# Patient Record
Sex: Female | Born: 1975 | Race: White | Hispanic: No | Marital: Married | Smoking: Never smoker
Health system: Southern US, Community
[De-identification: ages and names within clinical notes are randomized; demographics above are authoritative.]

## PROBLEM LIST (undated history)

## (undated) DIAGNOSIS — M545 Low back pain, unspecified: Secondary | ICD-10-CM

## (undated) DIAGNOSIS — G43909 Migraine, unspecified, not intractable, without status migrainosus: Secondary | ICD-10-CM

## (undated) HISTORY — DX: Low back pain: M54.5

## (undated) HISTORY — DX: Low back pain, unspecified: M54.50

## (undated) HISTORY — PX: BACK SURGERY: SHX140

---

## 2002-10-12 ENCOUNTER — Encounter: Payer: Self-pay | Admitting: Family Medicine

## 2002-10-12 ENCOUNTER — Ambulatory Visit (HOSPITAL_COMMUNITY): Admission: RE | Admit: 2002-10-12 | Discharge: 2002-10-12 | Payer: Self-pay | Admitting: Family Medicine

## 2010-01-11 ENCOUNTER — Emergency Department (HOSPITAL_BASED_OUTPATIENT_CLINIC_OR_DEPARTMENT_OTHER)
Admission: EM | Admit: 2010-01-11 | Discharge: 2010-01-12 | Payer: Self-pay | Source: Home / Self Care | Admitting: Emergency Medicine

## 2012-10-20 ENCOUNTER — Emergency Department (HOSPITAL_BASED_OUTPATIENT_CLINIC_OR_DEPARTMENT_OTHER)
Admission: EM | Admit: 2012-10-20 | Discharge: 2012-10-20 | Disposition: A | Discharge status: Home / Self Care | Payer: 59 | Attending: Emergency Medicine | Admitting: Emergency Medicine

## 2012-10-20 ENCOUNTER — Encounter (HOSPITAL_BASED_OUTPATIENT_CLINIC_OR_DEPARTMENT_OTHER): Payer: Self-pay | Admitting: Emergency Medicine

## 2012-10-20 ENCOUNTER — Emergency Department (HOSPITAL_BASED_OUTPATIENT_CLINIC_OR_DEPARTMENT_OTHER): Payer: 59

## 2012-10-20 DIAGNOSIS — Y9289 Other specified places as the place of occurrence of the external cause: Secondary | ICD-10-CM | POA: Insufficient documentation

## 2012-10-20 DIAGNOSIS — S339XXA Sprain of unspecified parts of lumbar spine and pelvis, initial encounter: Secondary | ICD-10-CM | POA: Insufficient documentation

## 2012-10-20 DIAGNOSIS — Y9302 Activity, running: Secondary | ICD-10-CM | POA: Insufficient documentation

## 2012-10-20 DIAGNOSIS — S39012A Strain of muscle, fascia and tendon of lower back, initial encounter: Secondary | ICD-10-CM

## 2012-10-20 DIAGNOSIS — X58XXXA Exposure to other specified factors, initial encounter: Secondary | ICD-10-CM | POA: Insufficient documentation

## 2012-10-20 MED ORDER — HYDROMORPHONE HCL PF 2 MG/ML IJ SOLN
2.0000 mg | Freq: Once | INTRAMUSCULAR | Status: AC
  Administered 2012-10-20: 2 mg via INTRAMUSCULAR
  Filled 2012-10-20: qty 1

## 2012-10-20 MED ORDER — CYCLOBENZAPRINE HCL 10 MG PO TABS: 10.0000 mg | ORAL_TABLET | Freq: Three times a day (TID) | ORAL | Status: DC | PRN

## 2012-10-20 MED ORDER — OXYCODONE-ACETAMINOPHEN 5-325 MG PO TABS: 1.0000 | ORAL_TABLET | Freq: Four times a day (QID) | ORAL | Status: DC | PRN

## 2012-10-20 MED ORDER — IBUPROFEN 800 MG PO TABS: 800.0000 mg | ORAL_TABLET | Freq: Three times a day (TID) | ORAL | Status: DC

## 2012-10-20 MED ORDER — KETOROLAC TROMETHAMINE 60 MG/2ML IM SOLN
60.0000 mg | Freq: Once | INTRAMUSCULAR | Status: AC
  Administered 2012-10-20: 60 mg via INTRAMUSCULAR
  Filled 2012-10-20: qty 2

## 2012-10-20 NOTE — ED Provider Notes (Signed)
CSN: [id number]     Arrival date & time 10/20/12  1308 History   First MD Initiated Contact with Patient 10/20/12 [phone]     Chief Complaint  Patient presents with  . Back Pain   (Consider location/radiation/quality/duration/timing/severity/associated sxs/prior Treatment) Patient is a 37 y.o. female presenting with back pain.  Back Pain  Pt with remote history of 'back problems' but who is otherwise very active running and exercising was doing an aerobic exercise 'boot camp' this morning when she had sudden onset of severe lower back pain, radiating to both sides and in L thigh but not down the leg. Pain is worse with movement, difficulty walking due to pain. No incontinence. No falls or trauma and no fever.   History reviewed. No pertinent past medical history. Past Surgical History  Procedure Laterality Date  . Cesarean section     No family history on file. History  Substance Use Topics  . Smoking status: Never Smoker   . Smokeless tobacco: Not on file  . Alcohol Use: No   OB History   Grav Para Term Preterm Abortions TAB SAB Ect Mult Living                 Review of Systems  Musculoskeletal: Positive for back pain.   All other systems reviewed and are negative except as noted in HPI.   Allergies  Review of patient's allergies indicates no known allergies.  Home Medications   Current Outpatient Rx  Name  Route  Sig  Dispense  Refill  . PRESCRIPTION MEDICATION      Unknown oral birth control          BP 121/67  Pulse 95  Temp(Src) 98.5 F (36.9 C) (Oral)  Resp 20  SpO2 100% Physical Exam  Nursing note and vitals reviewed. Constitutional: She is oriented to person, place, and time. She appears well-developed and well-nourished.  HENT:  Head: Normocephalic and atraumatic.  Eyes: EOM are normal. Pupils are equal, round, and reactive to light.  Neck: Normal range of motion. Neck supple.  Cardiovascular: Normal rate, normal heart sounds and intact distal  pulses.   Pulmonary/Chest: Effort normal and breath sounds normal.  Abdominal: Bowel sounds are normal. She exhibits no distension. There is no tenderness.  Musculoskeletal: Normal range of motion. She exhibits tenderness (tender over the lower lumbar/upper sacral area, moderate paraspinal muscle spasm, no sciatic notch tenderness bilaterally). She exhibits no edema.  Neurological: She is alert and oriented to person, place, and time. She has normal strength. She displays normal reflexes. No cranial nerve deficit or sensory deficit.  Skin: Skin is warm and dry. No rash noted.  Psychiatric: She has a normal mood and affect.    ED Course  Procedures (including critical care time) Labs Review Labs Reviewed - No data to display Imaging Review Dg Lumbar Spine 2-3 Views  10/20/2012   CLINICAL DATA:  37 year old female with low back pain, abrupt onset. Initial encounter.  EXAM: LUMBAR SPINE - 2-3 VIEW  COMPARISON:  01/11/2010.  FINDINGS: Mildly transitional anatomy. With the lowest full size ribs designated T12, lumbar segmentation is normal except for vestigial ribs at L1. Sacral ala and SI joints within normal limits. Stable and normal lumbar vertebral height and alignment. Stable and relatively preserved disc spaces. Bone mineralization is within normal limits. Visible lower thoracic levels appear intact.  IMPRESSION: No acute osseous abnormality identified in the lumbar spine.   Electronically Signed   By: Augusto Gamble M.D.   On:  10/20/2012 07:25    MDM   1. Lumbosacral strain, initial encounter     Xray images reviewed. Pt feeling much better. Suspect muscle strain/spasm. Will plan D/C with pain meds, muscle relaxer. Advised rest, warm shower and return for any worsening.     Mayumi Summerson B. Bernette Mayers, MD 10/20/12 [phone]

## 2012-10-20 NOTE — ED Notes (Signed)
Pt c/o lower back pain sudden onset while working out this am

## 2012-12-05 ENCOUNTER — Encounter: Payer: Self-pay | Admitting: Neurology

## 2012-12-05 ENCOUNTER — Ambulatory Visit (INDEPENDENT_AMBULATORY_CARE_PROVIDER_SITE_OTHER): Payer: 59 | Admitting: Neurology

## 2012-12-05 VITALS — BP 105/74 | HR 76 | Temp 97.8°F | Ht 64.0 in | Wt 138.6 lb

## 2012-12-05 DIAGNOSIS — M5417 Radiculopathy, lumbosacral region: Secondary | ICD-10-CM | POA: Insufficient documentation

## 2012-12-05 DIAGNOSIS — IMO0002 Reserved for concepts with insufficient information to code with codable children: Secondary | ICD-10-CM

## 2012-12-05 MED ORDER — IBUPROFEN 800 MG PO TABS: 800.0000 mg | ORAL_TABLET | Freq: Three times a day (TID) | ORAL | Status: DC

## 2012-12-05 MED ORDER — GABAPENTIN 100 MG PO CAPS: 100.0000 mg | ORAL_CAPSULE | Freq: Three times a day (TID) | ORAL | Status: DC

## 2012-12-05 NOTE — Progress Notes (Signed)
[facility] Clinic Note - Initial Visit   Date: 12/05/2012    Gabrielle Graham MRN: [id number] DOB: 1975-04-15   Dear Dr Daleen Squibb:  Thank you for your kind referral of Gabrielle Graham for consultation of back pain. Although her history is well known to you, please allow [place] to reiterate it for the purpose of our medical record. The patient was accompanied to the clinic by self.   History of Present Illness: Gabrielle Graham is a 37 y.o. year-old right-handed Caucasian female with no prior medical history presenting for evalulation of low back pain.    In mid-September 2014, she was doing jump squats at a bootcamp class.  When she came down after her third set, she heard a pop and when she landed, she was unable to walk.  She has excuciating back pain.  She went to [facility] where plain films of the x-ray was done and given muscle relaxants.  It improved slightly after 5-days, but then has progressively worsened.  She started developing tingling down her legs (left > right) and numbness of her feet.  Symptoms are better when she is standing and worse when she is laying flat or sitting.  She was given a medrol dose pak without significant change.  She takes ibuprofen 800mg  which eases the pain slightly.  She had not formal physical therapy.  Denies any bowel/bladder problems. No falls.  She takes flexiril when her pain is severe because it helps her rest better, but does not alleviate the pain. MRI performed on 11/6 of the lumbar spine shows focal central disc protrusion at L5-S1.  Out-side paper records, electronic medical record, and images have been reviewed where available and summarized as:  XR lumbar spine 10/20/2012 Mildly transitional anatomy. With the lowest full size ribs designated T12, lumbar segmentation is normal except for vestigial  ribs at L1. Sacral ala and SI joints within normal limits. Stable and normal lumbar vertebral height and alignment. Stable  and  relatively preserved disc spaces. Bone mineralization is within normal limits. Visible lower thoracic levels appear intact.  IMPRESSION:  No acute osseous abnormality identified in the lumbar spine.   MRI lumbar spine 11/25/2012:  focal central disc protrusion at L5-S1 contacting the ventral thecal sac and potentially irritating the S1 nerve roots.  No foraminal stenosis.   History reviewed. No pertinent past medical history.  Past Surgical History  Procedure Laterality Date  . Cesarean section       Medications:  Current Outpatient Prescriptions on File Prior to Visit  Medication Sig Dispense Refill  . cyclobenzaprine (FLEXERIL) 10 MG tablet Take 1 tablet (10 mg total) by mouth 3 (three) times daily as needed for muscle spasms.  30 tablet  0  . ibuprofen (ADVIL,MOTRIN) 800 MG tablet Take 1 tablet (800 mg total) by mouth 3 (three) times daily.  21 tablet  0  . oxyCODONE-acetaminophen (PERCOCET/ROXICET) 5-325 MG per tablet Take 1-2 tablets by mouth every 6 (six) hours as needed for pain.  20 tablet  0  . PRESCRIPTION MEDICATION Unknown oral birth control       No current facility-administered medications on file prior to visit.    Allergies: No Known Allergies  Family History: History reviewed. No pertinent family history.  Social History: History   Social History  . Marital Status: Married    Spouse Name: N/A    Number of Children: N/A  . Years of Education: N/A   Occupational History  . Not on file.  Social History Main Topics  . Smoking status: Never Smoker   . Smokeless tobacco: Not on file  . Alcohol Use: No  . Drug Use: Not on file  . Sexual Activity: Not on file   Other Topics Concern  . Not on file   Social History Narrative  . No narrative on file    Review of Systems:  CONSTITUTIONAL: No fevers, chills, night sweats, or weight loss.   EYES: No visual changes or eye pain ENT: No hearing changes.  No history of nose bleeds.   RESPIRATORY: No  cough, wheezing and shortness of breath.   CARDIOVASCULAR: Negative for chest pain, and palpitations.   GI: Negative for abdominal discomfort, blood in stools or black stools.  No recent change in bowel habits.   GU:  No history of incontinence.   MUSCLOSKELETAL: + back pain.  No myalgias.   SKIN: Negative for lesions, rash, and itching.   HEMATOLOGY/ONCOLOGY: Negative for prolonged bleeding, bruising easily, and swollen nodes.  No history of cancer.   ENDOCRINE: Negative for cold or heat intolerance, polydipsia or goiter.   PSYCH:  No depression or anxiety symptoms.   NEURO: As Above.   Vital Signs:  BP 105/74  Pulse 76  Temp(Src) 97.8 F (36.6 C) (Oral)  Ht 5\' 4"  (1.626 m)  Wt 138 lb 9.6 oz (62.869 kg)  BMI 23.78 kg/m2  LMP 11/02/2012   General Medical Exam:   General:  Mild distress due to pain, comfortable.   Eyes/ENT: see cranial nerve examination.   Back:  Tenderness to palpation over the lumbosacral paraspinal muscles.   Extremities:  No deformities, edema, or skin discoloration.  Skin:  Port wine stain birthmark present over the right upper extremity.   Neurological Exam: MENTAL STATUS including orientation to time, place, person, recent and remote memory, attention span and concentration, language, and fund of knowledge is normal.  Speech is not dysarthric.  CRANIAL NERVES: II:  No visual field defects.  Unremarkable fundi.   III-IV-VI: Pupils equal round and reactive to light.  Normal conjugate, extra-ocular eye movements in all directions of gaze.  No nystagmus.  No ptosis.   V:  Normal facial sensation.     VII:  Normal facial symmetry and movements.   VIII:  Normal hearing and vestibular function.   IX-X:  Normal palatal movement.   XI:  Normal shoulder shrug and head rotation.   XII:  Normal tongue strength and range of motion, no deviation or fasciculation.  MOTOR:  *Motor strength testing of the legs is somewhat pain limited, but there is no focal weakness.   No atrophy, fasciculations or abnormal movements.  No pronator drift.  Tone is normal.    Right Upper Extremity:    Left Upper Extremity:    Deltoid  5/5   Deltoid  5/5   Biceps  5/5   Biceps  5/5   Triceps  5/5   Triceps  5/5   Wrist extensors  5/5   Wrist extensors  5/5   Wrist flexors  5/5   Wrist flexors  5/5   Finger extensors  5/5   Finger extensors  5/5   Finger flexors  5/5   Finger flexors  5/5   Dorsal interossei  5/5   Dorsal interossei  5/5   Abductor pollicis  5/5   Abductor pollicis  5/5   Tone (Ashworth scale)  0  Tone (Ashworth scale)  0   Right Lower Extremity:*    Left Lower  Extremity: *   Hip flexors  5/5   Hip flexors  5/5   Hip extensors  5/5   Hip extensors  5/5   Knee flexors  5/5   Knee flexors  5/5   Knee extensors  5/5   Knee extensors  5/5   Dorsiflexors  5/5   Dorsiflexors  5/5   Plantarflexors  5/5   Plantarflexors  5/5   Toe extensors  5/5   Toe extensors  5/5   Toe flexors  5/5   Toe flexors  5/5   Tone (Ashworth scale)  0  Tone (Ashworth scale)  0   MSRs:  Right                                                                 Left brachioradialis 2+  brachioradialis 2+  biceps 2+  biceps 2+  triceps 2+  triceps 2+  patellar 2+  patellar 2+  ankle jerk 2+  ankle jerk 2+  Hoffman no  Hoffman no  plantar response down  plantar response down   SENSORY:  Diminished pin prick over the lateral foot (left 20%, right 80%) and vibration is reduced at great toe on the left (70%).  Sensation in the arms is normal to all modalities. Romberg's sign absent.   COORDINATION/GAIT: Normal finger-to- nose-finger and heel-to-shin.  Intact rapid alternating movements bilaterally.  Able to rise from a chair without using arms.  Gait narrow based and stable. Tandem and stressed gait intact.    IMPRESSION: Gabrielle Graham is a 37 year-old female presenting for evaluation of low back pain.  Her neurological examination discloses sensory loss over the S1 dermatome (L >R) with  preserved Achilles reflexes and motor strength.  Imaging shows S1 disc protrusion with possible irritation of the S1 nerve root.  I have personally reviewed these images with the patient.  History, exam, and imaging is consistent with bilateral S1 radiculopathy, worse on the left side.  No need for EMG at this time.  Management is conservative and includes NSAIDS, muscle relaxants, and physical therapy.  I discussed that if there is no improvement with these strategies, I can refer her for steroid injections.     PLAN/RECOMMENDATIONS:  1.  Start physical therapy for low back pain and S1 radiculopathy 2.  Trial of neurontin 100mg  at bedtime x 2 days, then increase to 200mg  x 2 days, then 300mg  at bedtime 3.  Continue to take motrin 800mg  every 6 hours and flexiril 10mg  as needed, may switch to robaxin going forward 4.  Ice, rest, and avoidance of weight-bearing exercises recommended 5.  Return to clinic 6-weeks, or sooner as needed   The duration of this appointment visit was 45 minutes of face-to-face time with the patient.  Greater than 50% of this time was spent in counseling, explanation of diagnosis, planning of further management, and coordination of care.   Thank you for allowing me to participate in patient's care.  If I can answer any additional questions, I would be pleased to do so.    Sincerely,    Alantis Bethune Graham. Allena Katz, DO

## 2012-12-05 NOTE — Patient Instructions (Signed)
1.  Start physical therapy for low back pain and S1 radiculopathy 2.  Trial of neurontin 100mg  at bedtime x 2 days, then increase to 200mg  x 2 days, then 300mg  at bedtime 3.  Continue to take motrin 800mg  every 6 hours as needed 4.  Return to clinic 6-weeks

## 2012-12-20 ENCOUNTER — Encounter: Payer: Self-pay | Admitting: Neurology

## 2012-12-20 ENCOUNTER — Other Ambulatory Visit: Payer: Self-pay | Admitting: *Deleted

## 2012-12-20 ENCOUNTER — Other Ambulatory Visit: Payer: Self-pay | Admitting: Neurology

## 2012-12-20 DIAGNOSIS — M545 Low back pain: Secondary | ICD-10-CM

## 2012-12-22 ENCOUNTER — Ambulatory Visit
Admission: RE | Admit: 2012-12-22 | Discharge: 2012-12-22 | Disposition: A | Discharge status: Home / Self Care | Payer: 59 | Source: Ambulatory Visit | Attending: Neurology | Admitting: Neurology

## 2012-12-22 ENCOUNTER — Other Ambulatory Visit: Payer: Self-pay | Admitting: *Deleted

## 2012-12-22 ENCOUNTER — Other Ambulatory Visit: Payer: 59

## 2012-12-22 DIAGNOSIS — M545 Low back pain: Secondary | ICD-10-CM

## 2012-12-22 MED ORDER — IOHEXOL 180 MG/ML  SOLN
1.0000 mL | Freq: Once | INTRAMUSCULAR | Status: AC | PRN
  Administered 2012-12-22: 1 mL via EPIDURAL

## 2012-12-22 MED ORDER — METHYLPREDNISOLONE ACETATE 40 MG/ML INJ SUSP (RADIOLOG
120.0000 mg | Freq: Once | INTRAMUSCULAR | Status: AC
  Administered 2012-12-22: 120 mg via EPIDURAL

## 2012-12-30 ENCOUNTER — Telehealth: Payer: Self-pay | Admitting: Neurology

## 2012-12-30 ENCOUNTER — Other Ambulatory Visit: Payer: Self-pay | Admitting: *Deleted

## 2012-12-30 DIAGNOSIS — M5417 Radiculopathy, lumbosacral region: Secondary | ICD-10-CM

## 2012-12-30 NOTE — Telephone Encounter (Signed)
Called patient to discuss ongoing radicular symptoms.  She had a steroid injection on 12/4 but did not feel any benefit.  Since Tuesday, she reports having mild L foot drop and worsening numbness/tingling on the left leg. She has been taking neurontin, flexiril, NSAIDs, and doing PT without any improvement in symptoms.  I discussed that since conservative measures are not working, she may need surgical evaluation.  Will refer the patient to Neurosurgery, she is requesting Dr. Jillyn Hidden P. Roney Jaffe at [facility].  She is in agreement of plan.  Gabrielle Graham K. Allena Katz, DO

## 2013-01-04 ENCOUNTER — Ambulatory Visit: Payer: 59 | Admitting: Neurology

## 2013-01-05 ENCOUNTER — Other Ambulatory Visit: Payer: Self-pay | Admitting: Neurosurgery

## 2013-01-05 DIAGNOSIS — M5126 Other intervertebral disc displacement, lumbar region: Secondary | ICD-10-CM

## 2013-02-22 ENCOUNTER — Other Ambulatory Visit: Payer: Self-pay | Admitting: Neurology

## 2013-02-23 NOTE — Telephone Encounter (Signed)
Ibuprofen RX approved and sent to [facility].

## 2013-07-27 ENCOUNTER — Other Ambulatory Visit: Payer: Self-pay | Admitting: Neurology

## 2013-07-28 NOTE — Telephone Encounter (Signed)
OK to refill #21 pills, 1 refill, but she should schedule follow-up visit to assess symptoms.  Thanks!

## 2013-07-28 NOTE — Telephone Encounter (Signed)
Last visit in November.  Cancelled f/u visit in December.  Ok to refill?  Does she need f/u appt?

## 2013-07-28 NOTE — Telephone Encounter (Signed)
Rx called in.  Message left on pharmacy voicemail that patient may have 1 refill but will need an appointment before any other refills are given.

## 2013-12-25 ENCOUNTER — Emergency Department (HOSPITAL_BASED_OUTPATIENT_CLINIC_OR_DEPARTMENT_OTHER): Payer: Managed Care, Other (non HMO)

## 2013-12-25 ENCOUNTER — Encounter (HOSPITAL_BASED_OUTPATIENT_CLINIC_OR_DEPARTMENT_OTHER): Payer: Self-pay | Admitting: Emergency Medicine

## 2013-12-25 ENCOUNTER — Emergency Department (HOSPITAL_BASED_OUTPATIENT_CLINIC_OR_DEPARTMENT_OTHER)
Admission: EM | Admit: 2013-12-25 | Discharge: 2013-12-25 | Disposition: A | Discharge status: Home / Self Care | Payer: Managed Care, Other (non HMO) | Attending: Emergency Medicine | Admitting: Emergency Medicine

## 2013-12-25 DIAGNOSIS — Z79899 Other long term (current) drug therapy: Secondary | ICD-10-CM | POA: Diagnosis not present

## 2013-12-25 DIAGNOSIS — R51 Headache: Secondary | ICD-10-CM

## 2013-12-25 DIAGNOSIS — G43909 Migraine, unspecified, not intractable, without status migrainosus: Secondary | ICD-10-CM | POA: Diagnosis not present

## 2013-12-25 DIAGNOSIS — R519 Headache, unspecified: Secondary | ICD-10-CM

## 2013-12-25 HISTORY — DX: Migraine, unspecified, not intractable, without status migrainosus: G43.909

## 2013-12-25 MED ORDER — KETOROLAC TROMETHAMINE 30 MG/ML IJ SOLN
30.0000 mg | Freq: Once | INTRAMUSCULAR | Status: AC
  Administered 2013-12-25: 30 mg via INTRAVENOUS
  Filled 2013-12-25: qty 1

## 2013-12-25 MED ORDER — DIPHENHYDRAMINE HCL 50 MG/ML IJ SOLN
25.0000 mg | Freq: Once | INTRAMUSCULAR | Status: AC
  Administered 2013-12-25: 25 mg via INTRAVENOUS
  Filled 2013-12-25: qty 1

## 2013-12-25 MED ORDER — METOCLOPRAMIDE HCL 5 MG/ML IJ SOLN
10.0000 mg | Freq: Once | INTRAMUSCULAR | Status: AC
  Administered 2013-12-25: 10 mg via INTRAVENOUS
  Filled 2013-12-25: qty 2

## 2013-12-25 MED ORDER — SODIUM CHLORIDE 0.9 % IV BOLUS (SEPSIS)
500.0000 mL | Freq: Once | INTRAVENOUS | Status: AC
  Administered 2013-12-25: 500 mL via INTRAVENOUS

## 2013-12-25 NOTE — ED Notes (Signed)
Pt states she started having a HA around thanksgiving, has an apt with pcp for MRI but can't wait, she's in too much pain

## 2013-12-25 NOTE — ED Provider Notes (Signed)
CSN: [id number]     Arrival date & time 12/25/13  1744 History   First MD Initiated Contact with Patient 12/25/13 1806     Chief Complaint  Patient presents with  . Headache     (Consider location/radiation/quality/duration/timing/severity/associated sxs/prior Treatment) HPI Comments: Pt comes in today with complaint of headache that has been going on for almost 3 weeks. Pt states that her pcp has her schedule for mri in 2 days and so she decided to come in today because she was tired of hurting. Pt is hoping to get the mri today. Denies numbness or weakness. States that she does have a history of migraines and she used maxalt without relief. No acute onset in nature. Pt states that she has some dizziness with change of position and bilateral blurred vision intermittently. Is able to ambulate without assistance. Does have a history of a blood patch after an epidural. No fever or history of drug use.   The history is provided by the patient. No language interpreter was used.    Past Medical History  Diagnosis Date  . Low back pain   . Migraine    Past Surgical History  Procedure Laterality Date  . Cesarean section    . Back surgery     Family History  Problem Relation Age of Onset  . Breast cancer Mother   . Hypertension Father   . Mood Disorder Father   . Healthy Brother   . Healthy Sister    History  Substance Use Topics  . Smoking status: Never Smoker   . Smokeless tobacco: Not on file  . Alcohol Use: Yes     Comment: rarely   OB History    No data available     Review of Systems  All other systems reviewed and are negative.     Allergies  Review of patient's allergies indicates no known allergies.  Home Medications   Prior to Admission medications   Medication Sig Start Date End Date Taking? Authorizing Provider  cyclobenzaprine (FLEXERIL) 10 MG tablet Take 1 tablet (10 mg total) by mouth 3 (three) times daily as needed for muscle spasms. 10/20/12   Charles  B. Bernette MayersSheldon, MD  gabapentin (NEURONTIN) 100 MG capsule Take 1 capsule (100 mg total) by mouth 3 (three) times daily. 12/05/12   Donika K Patel, DO  ibuprofen (ADVIL,MOTRIN) 800 MG tablet TAKE 1 TABLET BY MOUTH THREE TIMES DAILY    Donika K Patel, DO  ibuprofen (ADVIL,MOTRIN) 800 MG tablet TAKE 1 TABLET BY MOUTH THREE TIMES DAILY    Donika K Patel, DO  oxyCODONE-acetaminophen (PERCOCET/ROXICET) 5-325 MG per tablet Take 1-2 tablets by mouth every 6 (six) hours as needed for pain. 10/20/12   Charles B. Bernette MayersSheldon, MD  PRESCRIPTION MEDICATION Unknown oral birth control    Historical Provider, MD   BP 143/88 mmHg  Pulse 88  Temp(Src) 98 F (36.7 C) (Oral)  Resp 18  Ht 5\' 4"  (1.626 m)  Wt 140 lb (63.504 kg)  BMI 24.02 kg/m2  SpO2 99%  LMP 12/11/2013 Physical Exam  Constitutional: She is oriented to person, place, and time. She appears well-developed and well-nourished.  HENT:  Head: Normocephalic and atraumatic.  Right Ear: External ear normal.  Left Ear: External ear normal.  Eyes: Conjunctivae and EOM are normal. Pupils are equal, round, and reactive to light.  Neck: Normal range of motion. Neck supple. No thyromegaly present.  Cardiovascular: Normal rate and regular rhythm.   Pulmonary/Chest: Effort normal and breath sounds  normal.  Abdominal: Soft. Bowel sounds are normal.  Musculoskeletal: Normal range of motion.  Neurological: She is alert and oriented to person, place, and time. No cranial nerve deficit. She exhibits normal muscle tone. Coordination normal. GCS eye subscore is 4. GCS verbal subscore is 5. GCS motor subscore is 6.  Negative finger to nose and romberg. Ambulatory with any problem.   Skin: Skin is warm and dry.  Nursing note and vitals reviewed.   ED Course  Procedures (including critical care time) Labs Review Labs Reviewed - No data to display  Imaging Review Ct Head Wo Contrast  12/25/2013   CLINICAL DATA:  Headache for 2 weeks.  EXAM: CT HEAD WITHOUT CONTRAST   TECHNIQUE: Contiguous axial images were obtained from the base of the skull through the vertex without intravenous contrast.  COMPARISON:  None.  FINDINGS: No acute cortical infarct, hemorrhage, or mass lesion ispresent. Ventricles are of normal size. No significant extra-axial fluid collection is present. The paranasal sinuses andmastoid air cells are clear. The osseous skull is intact.  IMPRESSION: Negative exam.   Electronically Signed   By: Signa Kellaylor  Stroud M.D.   On: 12/25/2013 19:45     EKG Interpretation None      MDM   Final diagnoses:  Headache    Pt is feeling better at this time. Pt is neurologically intact. Discussed with pt states that this doesn't rule out the need for her mri that she needs to discuss that with her pcp.    Teressa LowerVrinda Ceola Para, NP 12/25/13 2025  Warnell Foresterrey Wofford, MD 12/25/13 2145

## 2013-12-25 NOTE — Discharge Instructions (Signed)

## 2015-09-30 IMAGING — CT CT HEAD W/O CM
1 series · 16 of 30 positions shown, 20 images · non-contrast
Comparison: None.

CLINICAL DATA: Headache for 2 weeks.

EXAM:
CT HEAD WITHOUT CONTRAST
TECHNIQUE: Contiguous axial images were obtained from the base of the skull
through the vertex without intravenous contrast.

[Series 2: head 4.8 h37s · axial · 0.44mm/px · z∈[-152,-12]mm · 16 of 32 slices shown, 20 images]
[im 2/32  brain]
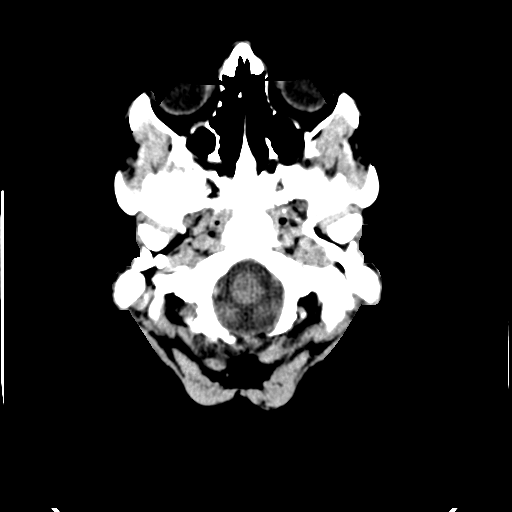
[im 2/32  bone]
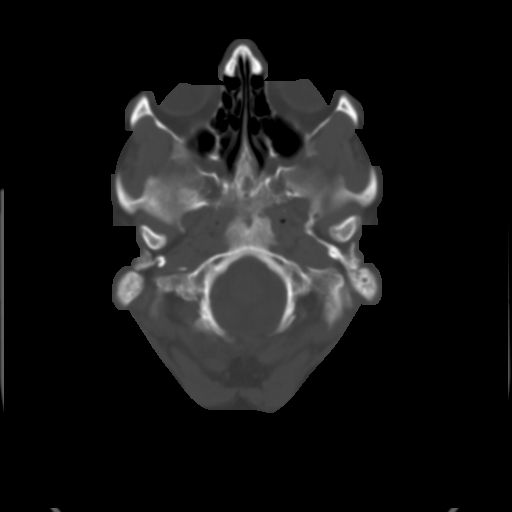
[im 4/32  brain]
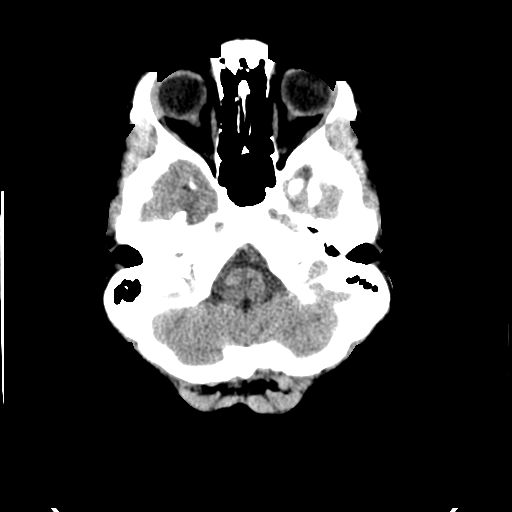
[im 6/32  brain]
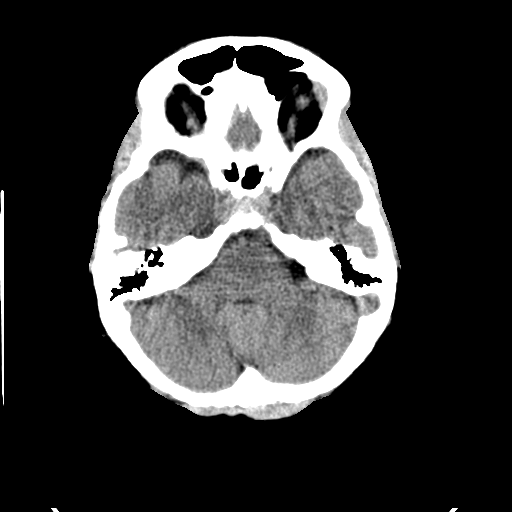
[im 8/32  brain]
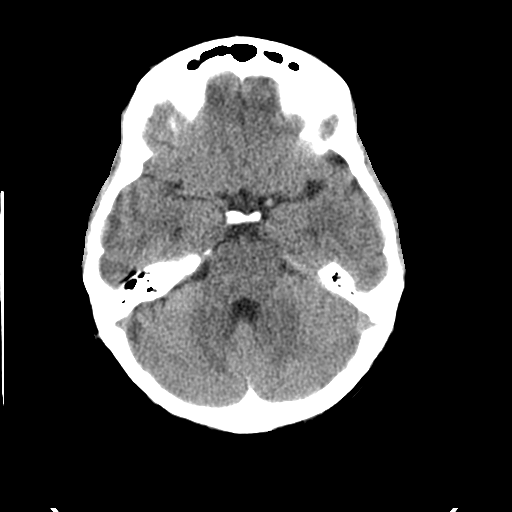
[im 9/32  brain]
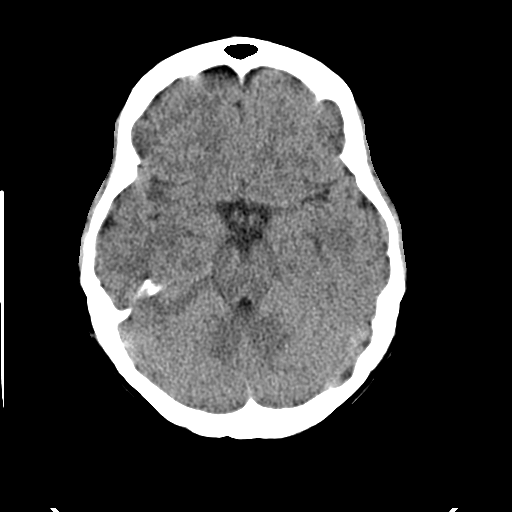
[im 9/32  bone]
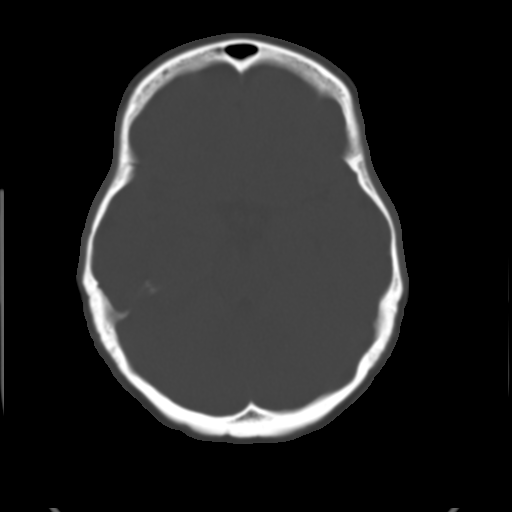
[im 11/32  brain]
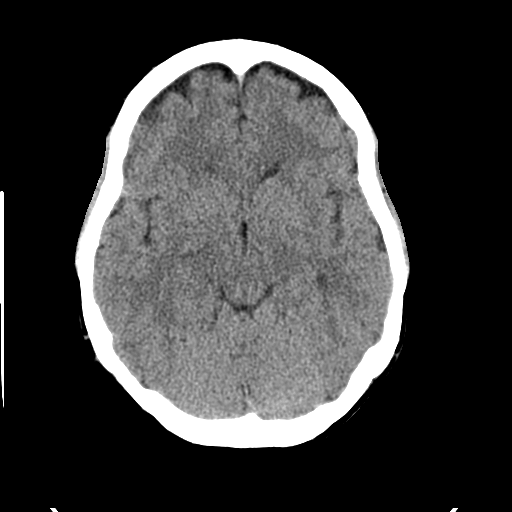
[im 13/32  brain]
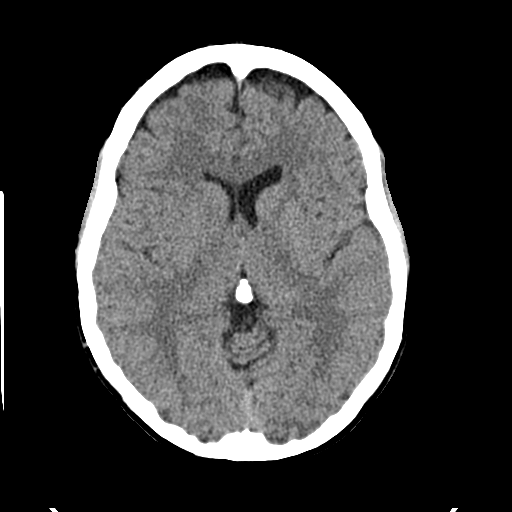
[im 15/32  brain]
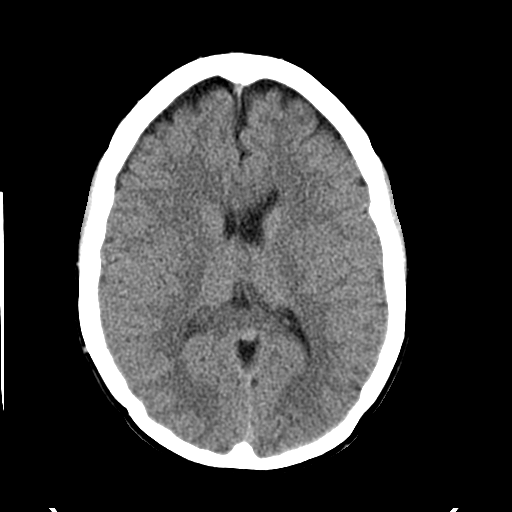
[im 17/32  brain]
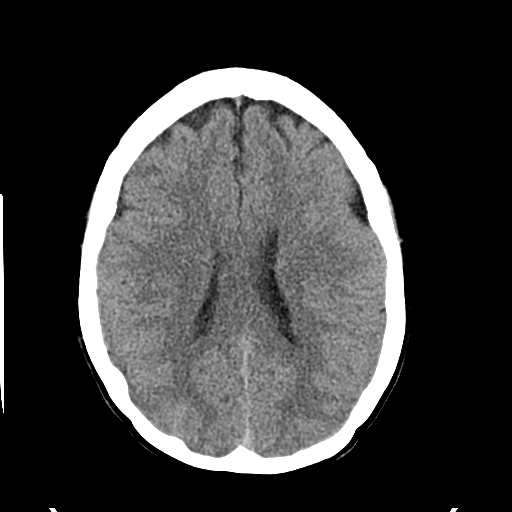
[im 17/32  bone]
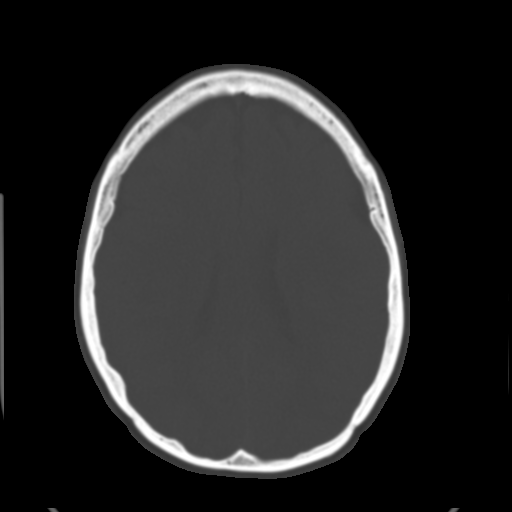
[im 19/32  brain]
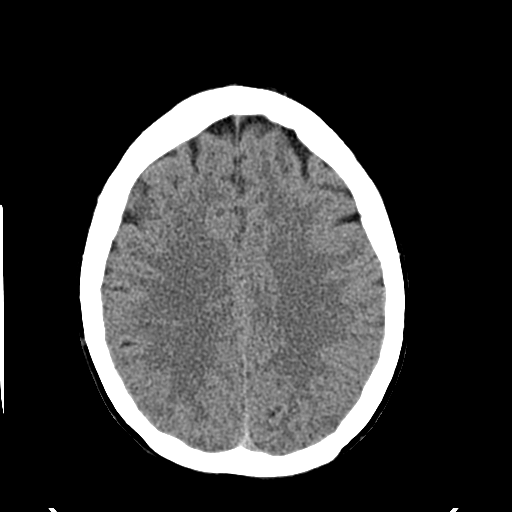
[im 21/32  brain]
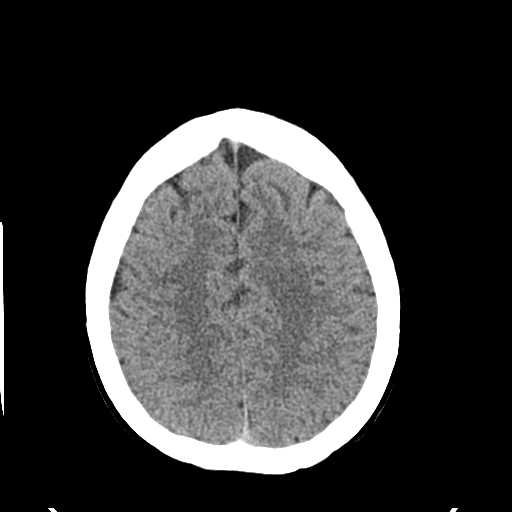
[im 23/32  brain]
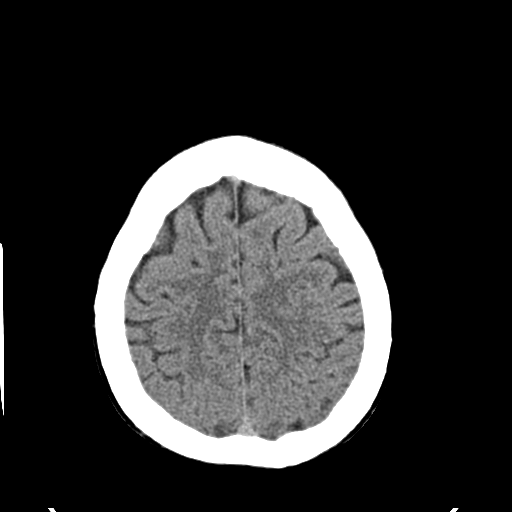
[im 24/32  brain]
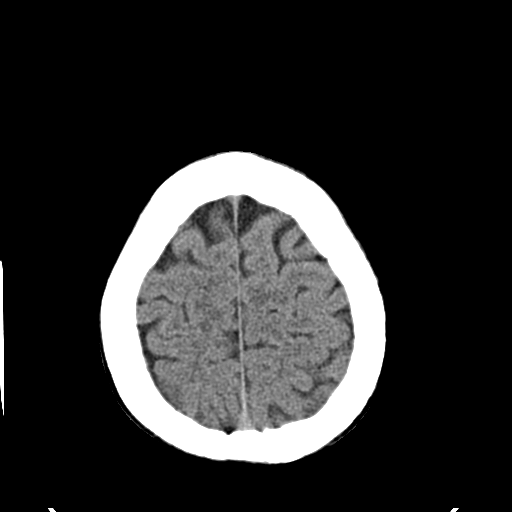
[im 24/32  bone]
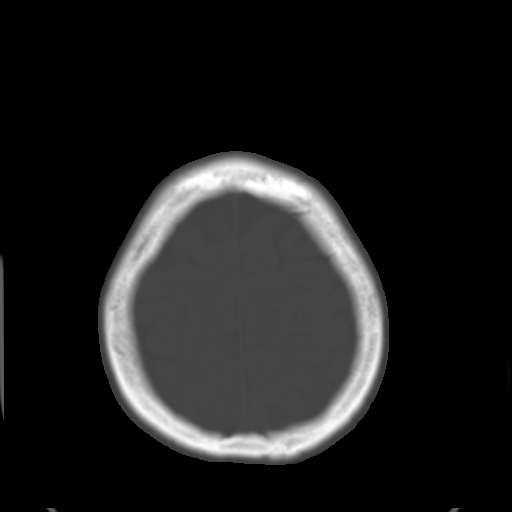
[im 26/32  brain]
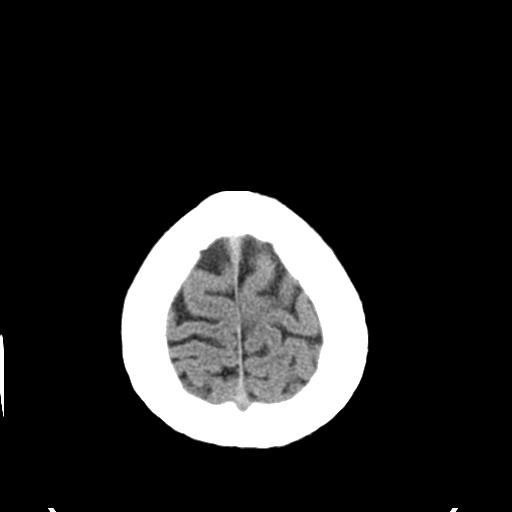
[im 28/32  brain]
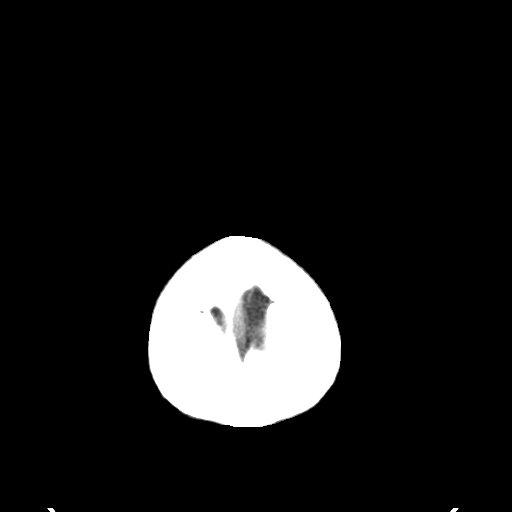
[im 30/32  brain]
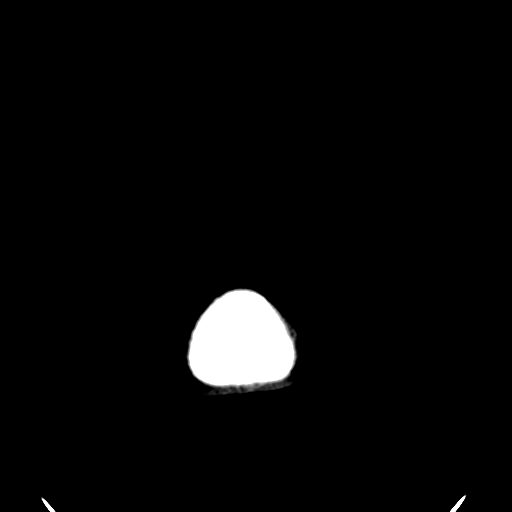

[16 of 30 positions shown; findings below may reference images not displayed]

FINDINGS: No acute cortical infarct, hemorrhage, or mass lesion ispresent.
Ventricles are of normal size. No significant extra-axial fluid
collection is present. The paranasal sinuses andmastoid air cells
are clear. The osseous skull is intact.
IMPRESSION: Negative exam.

## 2018-09-08 ENCOUNTER — Ambulatory Visit: Payer: Self-pay | Admitting: Neurology

## 2018-09-12 ENCOUNTER — Encounter: Payer: Self-pay | Admitting: Neurology

## 2018-09-12 ENCOUNTER — Ambulatory Visit (INDEPENDENT_AMBULATORY_CARE_PROVIDER_SITE_OTHER): Payer: 59 | Admitting: Neurology

## 2018-09-12 ENCOUNTER — Other Ambulatory Visit: Payer: Self-pay

## 2018-09-12 VITALS — BP 138/91 | HR 100 | Temp 97.8°F | Ht 64.0 in | Wt 127.5 lb

## 2018-09-12 DIAGNOSIS — H81399 Other peripheral vertigo, unspecified ear: Secondary | ICD-10-CM | POA: Diagnosis not present

## 2018-09-12 DIAGNOSIS — R413 Other amnesia: Secondary | ICD-10-CM

## 2018-09-12 DIAGNOSIS — R42 Dizziness and giddiness: Secondary | ICD-10-CM | POA: Diagnosis not present

## 2018-09-12 DIAGNOSIS — F4323 Adjustment disorder with mixed anxiety and depressed mood: Secondary | ICD-10-CM

## 2018-09-12 DIAGNOSIS — R292 Abnormal reflex: Secondary | ICD-10-CM

## 2018-09-12 DIAGNOSIS — G4700 Insomnia, unspecified: Secondary | ICD-10-CM

## 2018-09-12 NOTE — Progress Notes (Signed)
 [facility]  PATIENT: Gabrielle Graham DOB: 03/21/1975  REFERRING DOCTOR OR PCP: Ted Nifong MD SOURCE: Patient, notes from primary care, imaging reports and images, lab reports  _________________________________   HISTORICAL  CHIEF COMPLAINT:  Chief Complaint  Patient presents with  . New Patient (Initial Visit)    RM 13, alone. Paper referral for MS concerns.     HISTORY OF PRESENT ILLNESS:  I had the pleasure seeing your patient, Gabrielle Graham, at [facility] for neurologic consultation regarding her multiple neurologic symptoms over the past few months.  She is a 42 year old woman with lightheadedness, dizziness, a heavy sensation in her head, leg weakness, fatigue and mental fog over the past 4-5 months.   She notes symptoms started after a hospitalization for pneumonia (negative test for Covid-19)     Symptoms have been present since discharge in April.   She notes she feels worse in the morning but has all the symptoms throughout the day.     She is more fatigued in the evenings.     She also notes that she has had some shortness of breath since the symptoms started as well.    When dizziness occurs it is lightheadedness.   No vertigo.  No change in hearing. She feels her heart is sometimes beating fast when the lightheadedness occurs.   The spells occur shortly after standing up.   She notes only mild lightheadedness when sitting.   She has had heart palpitations the last few months as well.  She has had an Echo and is scheduled to have a Holter monitor to further evaluate.    A Ziehl patch x 2 weeks showed most symptoms occurred while she had sinus bradycardia, though rate was often in the 100-110 range.     Her legs feel heavy.   She used to run daily but now as she walks her legs feel weaker and heavier as she walks.   She gets some twitches in her legs at times.   She notes no pain, numbness/tingling or reduced coordination.    She has mild  rnary urgency but this is not new.     The mental fog issues are mostly reduced short term memory, poor focus/attention, word finding (she feels less articulate than before April).   Math ability seems the same.    She feels apathy.   She has had more anxiety and feels frustrated.  She sees psychiatry and was started on 0.125 mg clonazepam (has 0.5 mg tablets and cuts in 1/4's).   In the past she has been on Cymbalta, Lexapro short term but felt fidgety on these med's.     She has had insomnia that started in April and she was on Ambien but now just uses melatonin.   She is getting 7 hours of sleep most nights now.    She does not feel she has restorative sleep.    She has not been told that she snores .    She does toss and turn a lot at night but she does not have RLS symptoms when she lays down.      I personally reviewed the CT scan report and images dated 12/25/2013.  The CT scan was normal.  I also reviewed rheumatologic and standard lab work from 08/30/2018.  ANA, RA, complement, and ESR were essentially normal.  REVIEW OF SYSTEMS: Constitutional: No fevers, chills, sweats, or change in appetite.  She has had some fatigue.  She has   some insomnia Eyes: No visual changes, double vision, eye pain Ear, nose and throat: No hearing loss, ear pain, nasal congestion, sore throat Cardiovascular: No chest pain, palpitations Respiratory: No shortness of breath at rest or with exertion.   No wheezes GastrointestinaI: No nausea, vomiting, diarrhea, abdominal pain, fecal incontinence Genitourinary: No dysuria, urinary retention or frequency.  No nocturia. Musculoskeletal: No neck pain, back pain Integumentary: No rash, pruritus, skin lesions Neurological: as above Psychiatric: Notes some depression and anxiety. Endocrine: No palpitations, diaphoresis, change in appetite, change in weigh or increased thirst Hematologic/Lymphatic: No anemia, purpura, petechiae. Allergic/Immunologic: No itchy/runny  eyes, nasal congestion, recent allergic reactions, rashes  ALLERGIES: No Known Allergies  HOME MEDICATIONS:  Current Outpatient Medications:  .  clonazePAM (KLONOPIN) 0.5 MG tablet, Take 0.5 mg by mouth., Disp: , Rfl:  .  norethindrone-ethinyl estradiol-iron (LOESTRIN FE) 1.5-30 MG-MCG tablet, Take 1 tablet by mouth daily., Disp: , Rfl:  .  omeprazole (PRILOSEC) 40 MG capsule, Take 40 mg by mouth daily., Disp: , Rfl:   PAST MEDICAL HISTORY: Past Medical History:  Diagnosis Date  . Low back pain   . Migraine     PAST SURGICAL HISTORY: Past Surgical History:  Procedure Laterality Date  . BACK SURGERY    . CESAREAN SECTION      FAMILY HISTORY: Family History  Problem Relation Age of Onset  . Breast cancer Mother   . Asthma Mother   . Hypertension Father   . Mood Disorder Father   . Healthy Brother   . Healthy Sister     SOCIAL HISTORY:  Social History   Socioeconomic History  . Marital status: Married    Spouse name: Not on file  . Number of children: Not on file  . Years of education: Not on file  . Highest education level: Not on file  Occupational History  . Not on file  Social Needs  . Financial resource strain: Not on file  . Food insecurity    Worry: Not on file    Inability: Not on file  . Transportation needs    Medical: Not on file    Non-medical: Not on file  Tobacco Use  . Smoking status: Never Smoker  . Smokeless tobacco: Never Used  Substance and Sexual Activity  . Alcohol use: Yes    Comment: rarely  . Drug use: No  . Sexual activity: Not on file  Lifestyle  . Physical activity    Days per week: Not on file    Minutes per session: Not on file  . Stress: Not on file  Relationships  . Social connections    Talks on phone: Not on file    Gets together: Not on file    Attends religious service: Not on file    Active member of club or organization: Not on file    Attends meetings of clubs or organizations: Not on file    Relationship  status: Not on file  . Intimate partner violence    Fear of current or ex partner: Not on file    Emotionally abused: Not on file    Physically abused: Not on file    Forced sexual activity: Not on file  Other Topics Concern  . Not on file  Social History Narrative   Works for [employer], practice performance manager   Lives with husband and three children   Caffeine use: none   Right handed      PHYSICAL EXAM  Vitals:   09/12/18 0841    BP: (!) 138/91  Pulse: 100  Temp: 97.8 F (36.6 C)  Weight: 127 lb 8 oz (57.8 kg)  Height: 5' 4" (1.626 m)    Body mass index is 21.89 kg/m.  Orthostatic VS 120/80 88 supine 115/80 84 sitting 112/80 92 standing 30 seconds 110/75 100 standing 2 minutes  General: The patient is well-developed and well-nourished and in no acute distress  HEENT:  Head is /AT.  Sclera are anicteric.  Funduscopic exam shows normal optic discs and retinal vessels.     Neck: No carotid bruits are noted.  The neck is nontender.  Cardiovascular: The heart has a regular rate and rhythm with a normal S1 and S2. There were no murmurs, gallops or rubs.    Skin: Extremities are without rash or  edema.  Musculoskeletal:  Back is nontender  Neurologic Exam  Mental status: The patient is alert and oriented x 3 at the time of the examination. The patient has apparent normal recent and remote memory, with an apparently normal attention span and concentration ability.   Speech is normal.  Cranial nerves: Extraocular movements are full. Pupils are equal, round, and reactive to light and accomodation.  Visual fields are full.  Facial symmetry is present. There is good facial sensation to soft touch bilaterally.Facial strength is normal.  Trapezius and sternocleidomastoid strength is normal. No dysarthria is noted.  The tongue is midline, and the patient has symmetric elevation of the soft palate.  Mildly reduced hearing on the right but no asymmetry in the Weber response. .   Motor:  Muscle bulk is normal.   Tone is normal. Strength is  5 / 5 in all 4 extremities.   Sensory: Sensory testing is intact to pinprick, soft touch and vibration sensation in all 4 extremities.  Coordination: Cerebellar testing reveals good finger-nose-finger and heel-to-shin bilaterally.  Gait and station: Station is normal.   Gait is normal. Tandem gait is normal. Romberg is negative.   Reflexes: Deep tendon reflexes are symmetric and increased at the knees with crossed abductor reflexes.   Plantar responses are flexor.      ASSESSMENT AND PLAN  1. Other peripheral vertigo, unspecified ear   2. Memory loss   3. Dizziness   4. Hyperreflexia   5. Adjustment reaction with anxiety and depression   6. Insomnia, unspecified type     In summary, Ms. Royle is a 42-year-old woman reporting multiple neurologic symptoms over the past several months.  She has noted more difficulty with memory as well as episodes of dizziness.  On examination she has hyperreflexia.  Due to these symptoms and the abnormal examination, we need to check an MRI of the brain to assess for demyelination, ischemic change and other processes.  We also discussed that memory loss at age 42 is usually due to difficulties with focus/attention.  Sleep disturbance and mood disturbance are most likely to cause this.  She has had some issues with mood and is seeing psychiatry.  She will be starting an antidepressant soon.  If symptoms persist in the MRI of the brain is normal, we will need to also consider a sleep study.  She will return to see me in 2 to 3 months or sooner if there are new or worsening neurologic symptoms.  Thank you for asking me to see Ms. Meyer.  Please let me know if I can be of further assistance with her or other patients in the future.   Richard A. Sater, MD, PhD,FAAN 09/12/2018, 5:36   PM Certified in Neurology, Clinical Neurophysiology, Sleep Medicine and Neuroimaging  [facility] [address], [address] [place], [place] [address] [phone] 

## 2018-09-13 ENCOUNTER — Telehealth: Payer: Self-pay | Admitting: Neurology

## 2018-09-13 NOTE — Telephone Encounter (Signed)
Lenox Ponds: [id number] (exp. 09/13/18 to 10/13/18) order sent to [facility]. They will obtain the auth for [employer] and will reach out to the patient to schedule.

## 2018-10-10 ENCOUNTER — Ambulatory Visit
Admission: RE | Admit: 2018-10-10 | Discharge: 2018-10-10 | Disposition: A | Discharge status: Home / Self Care | Payer: PRIVATE HEALTH INSURANCE | Source: Ambulatory Visit | Attending: Neurology | Admitting: Neurology

## 2018-10-10 DIAGNOSIS — R413 Other amnesia: Secondary | ICD-10-CM

## 2018-10-10 MED ORDER — GADOBENATE DIMEGLUMINE 529 MG/ML IV SOLN
12.0000 mL | Freq: Once | INTRAVENOUS | Status: AC | PRN
  Administered 2018-10-10: 12 mL via INTRAVENOUS

## 2018-10-11 ENCOUNTER — Telehealth: Payer: Self-pay | Admitting: *Deleted

## 2018-10-11 NOTE — Telephone Encounter (Signed)
-----   Message from Britt Bottom, MD sent at 10/11/2018  9:16 AM EDT ----- Please let the patient know that the MRI was normal

## 2018-11-14 ENCOUNTER — Ambulatory Visit: Payer: Self-pay | Admitting: Neurology

## 2018-11-16 ENCOUNTER — Telehealth (INDEPENDENT_AMBULATORY_CARE_PROVIDER_SITE_OTHER): Payer: 59 | Admitting: Family Medicine

## 2018-11-16 ENCOUNTER — Other Ambulatory Visit: Payer: Self-pay

## 2018-11-16 DIAGNOSIS — G4700 Insomnia, unspecified: Secondary | ICD-10-CM | POA: Diagnosis not present

## 2018-11-16 DIAGNOSIS — F4323 Adjustment disorder with mixed anxiety and depressed mood: Secondary | ICD-10-CM

## 2018-11-16 MED ORDER — BUSPIRONE HCL 10 MG PO TABS: 10.0000 mg | ORAL_TABLET | Freq: Three times a day (TID) | ORAL | 11 refills | Status: AC

## 2018-11-16 NOTE — Progress Notes (Signed)
PATIENT: Gabrielle Graham DOB: October 09, 1975  REASON FOR VISIT: follow up HISTORY FROM: patient  Virtual Visit via Telephone Note  I connected with Daiva Eves on 11/17/18 at  7:30 AM EDT by telephone and verified that I am speaking with the correct person using two identifiers.   I discussed the limitations, risks, security and privacy concerns of performing an evaluation and management service by telephone and the availability of in person appointments. I also discussed with the patient that there may be a patient responsible charge related to this service. The patient expressed understanding and agreed to proceed.   History of Present Illness:  11/17/18 Gabrielle Graham is a 43 y.o. female here today for follow up. She is having a difficult time sleeping over the past 6 months (has significantly worsened over the past 2-3 months). She has tried multiple modalities of treatments including OTC sleep aids, Melatonin, Valerian root, Lunesta, Ambien, clonazepam and Ativan. She is very conscious about sleep hygiene practices. She sleeps alone; her husband is sleeping in a different room currently. She will meditate and stretch just prior to taking a warm shower. She then  She is currently undergoing evaluations with cardiology and pulmonology s/p hospitalization in April for PNA. She has had DOE, weakness, palpitations with HR 120-150, trouble sleeping, and mental fog. Workup thus far has been unremarkable and no cause for symptoms identified. Up until April, she was very active. She exercised daily and was able to run 4+ miles 3-4 times weekly. She has been unable to continue this regimen. She is trying to push herself to get back to regular exercise.  She is followed closely by primary care for concerns of anxiety.  She has failed antidepressant medications including Lexapro, Prozac as well as Cymbalta.  She reports that these medications make her feel jittery and worsened insomnia.  She is scheduled to see  psychiatry this week.   History (copied from Dr Garth Bigness note on 09/12/2018)  I had the pleasure seeing your patient, Gabrielle Graham, at [facility] for neurologic consultation regarding her multiple neurologic symptoms over the past few months.  She is a 43 year old woman with lightheadedness, dizziness, a heavy sensation in her head, leg weakness, fatigue and mental fog over the past 4-5 months.   She notes symptoms started after a hospitalization for pneumonia (negative test for Covid-19)     Symptoms have been present since discharge in April.   She notes she feels worse in the morning but has all the symptoms throughout the day.     She is more fatigued in the evenings.     She also notes that she has had some shortness of breath since the symptoms started as well.    When dizziness occurs it is lightheadedness.   No vertigo.  No change in hearing. She feels her heart is sometimes beating fast when the lightheadedness occurs.   The spells occur shortly after standing up.   She notes only mild lightheadedness when sitting.   She has had heart palpitations the last few months as well.  She has had an Echo and is scheduled to have a Holter monitor to further evaluate.    A Ziehl patch x 2 weeks showed most symptoms occurred while she had sinus bradycardia, though rate was often in the 100-110 range.     Her legs feel heavy.   She used to run daily but now as she walks her legs feel weaker and heavier as she walks.  She gets some twitches in her legs at times.   She notes no pain, numbness/tingling or reduced coordination.    She has mild rnary urgency but this is not new.     The mental fog issues are mostly reduced short term memory, poor focus/attention, word finding (she feels less articulate than before April).   Math ability seems the same.    She feels apathy.   She has had more anxiety and feels frustrated.  She sees psychiatry and was started on 0.125 mg clonazepam (has  0.5 mg tablets and cuts in 1/4's).   In the past she has been on Cymbalta, Lexapro short term but felt fidgety on these med's.     She has had insomnia that started in April and she was on Ambien but now just uses melatonin.   She is getting 7 hours of sleep most nights now.    She does not feel she has restorative sleep.    She has not been told that she snores .    She does toss and turn a lot at night but she does not have RLS symptoms when she lays down.      I personally reviewed the CT scan report and images dated 12/25/2013.  The CT scan was normal.  I also reviewed rheumatologic and standard lab work from 08/30/2018.  ANA, RA, complement, and ESR were essentially normal.    Observations/Objective:  Generalized: Well developed, in no acute distress  Mentation: Alert oriented to time, place, history taking. Follows all commands speech and language fluent   Assessment and Plan:  44 y.o. year old female  has a past medical history of Low back pain and Migraine. here with    ICD-10-CM   1. Insomnia, unspecified type  G47.00   2. Adjustment reaction with anxiety and depression  F43.23    Gabrielle Graham continues to suffer from a constellation of symptoms following illness requiring hospitalization in April.  Over the past few months insomnia has continued to worsen.  She has taken multiple sleep aids both over-the-counter and prescription with little benefit.  She is also being monitored closely for concerns of anxiety.  She will continue with plans to see psychiatry this week.  We have discussed considering BuSpar.  I feel that this medication may help with her anxiety as well as relaxation at night.  We have discussed potential side effects of this medication.  I do not feel sleep study is warranted at this time as there are no concerns of sleep apnea.  No concerns of narcolepsy.  She will continue close follow-up with cardiology and pulmonology for shortness of breath and tachycardia.  She will  follow-up with me in 6 months, sooner if needed.  She verbalizes understanding and agreement with this plan.   No orders of the defined types were placed in this encounter.   Meds ordered this encounter  Medications   busPIRone (BUSPAR) 10 MG tablet    Sig: Take 1 tablet (10 mg total) by mouth 3 (three) times daily. Start 1 tablet at bedtime for 1 week, then increase to 62m twice daily for 1-2 weeks, 1 tablet three times daily    Dispense:  90 tablet    Refill:  11    Order Specific Question:   Supervising Provider    Answer:   AMelvenia Beam[[[id number]    Follow Up Instructions:  I discussed the assessment and treatment plan with the patient. The patient was provided an  opportunity to ask questions and all were answered. The patient agreed with the plan and demonstrated an understanding of the instructions.   The patient was advised to call back or seek an in-person evaluation if the symptoms worsen or if the condition fails to improve as anticipated.  I provided 20 minutes of non-face-to-face time during this encounter.  Patient is located at her place of residence during my chart visit.  Provider is in the office.   Debbora Presto, NP

## 2018-11-17 ENCOUNTER — Encounter: Payer: Self-pay | Admitting: Family Medicine

## 2018-11-17 NOTE — Progress Notes (Signed)
I have read the note, and I agree with the clinical assessment and plan.  Richard A. Sater, MD, PhD, FAAN Certified in Neurology, Clinical Neurophysiology, Sleep Medicine, Pain Medicine and Neuroimaging  [facility] [address], [address] [place], [place] [address] [phone]  

## 2018-11-17 NOTE — Patient Instructions (Signed)
We will start Buspar as prescribed. Follow up with PCP, cardiology and pulmonology as directed. Psychiatry consult this week.   Follow up with me in 6 months, sooner if needed    Buspirone tablets What is this medicine? BUSPIRONE (byoo SPYE rone) is used to treat anxiety disorders. This medicine may be used for other purposes; ask your health care provider or pharmacist if you have questions. COMMON BRAND NAME(S): BuSpar What should I tell my health care provider before I take this medicine? They need to know if you have any of these conditions:  kidney or liver disease  an unusual or allergic reaction to buspirone, other medicines, foods, dyes, or preservatives  pregnant or trying to get pregnant  breast-feeding How should I use this medicine? Take this medicine by mouth with a glass of water. Follow the directions on the prescription label. You may take this medicine with or without food. To ensure that this medicine always works the same way for you, you should take it either always with or always without food. Take your doses at regular intervals. Do not take your medicine more often than directed. Do not stop taking except on the advice of your doctor or health care professional. Talk to your pediatrician regarding the use of this medicine in children. Special care may be needed. Overdosage: If you think you have taken too much of this medicine contact a poison control center or emergency room at once. NOTE: This medicine is only for you. Do not share this medicine with others. What if I miss a dose? If you miss a dose, take it as soon as you can. If it is almost time for your next dose, take only that dose. Do not take double or extra doses. What may interact with this medicine? Do not take this medicine with any of the following medications:  linezolid  MAOIs like Carbex, Eldepryl, Marplan, Nardil, and Parnate  methylene blue  procarbazine This medicine may also interact  with the following medications:  diazepam  digoxin  diltiazem  erythromycin  grapefruit juice  haloperidol  medicines for mental depression or mood problems  medicines for seizures like carbamazepine, phenobarbital and phenytoin  nefazodone  other medications for anxiety  rifampin  ritonavir  some antifungal medicines like itraconazole, ketoconazole, and voriconazole  verapamil  warfarin This list may not describe all possible interactions. Give your health care provider a list of all the medicines, herbs, non-prescription drugs, or dietary supplements you use. Also tell them if you smoke, drink alcohol, or use illegal drugs. Some items may interact with your medicine. What should I watch for while using this medicine? Visit your doctor or health care professional for regular checks on your progress. It may take 1 to 2 weeks before your anxiety gets better. You may get drowsy or dizzy. Do not drive, use machinery, or do anything that needs mental alertness until you know how this drug affects you. Do not stand or sit up quickly, especially if you are an older patient. This reduces the risk of dizzy or fainting spells. Alcohol can make you more drowsy and dizzy. Avoid alcoholic drinks. What side effects may I notice from receiving this medicine? Side effects that you should report to your doctor or health care professional as soon as possible:  blurred vision or other vision changes  chest pain  confusion  difficulty breathing  feelings of hostility or anger  muscle aches and pains  numbness or tingling in hands or feet  ringing in the ears  skin rash and itching  vomiting  weakness Side effects that usually do not require medical attention (report to your doctor or health care professional if they continue or are bothersome):  disturbed dreams, nightmares  headache  nausea  restlessness or nervousness  sore throat and nasal congestion  stomach  upset This list may not describe all possible side effects. Call your doctor for medical advice about side effects. You may report side effects to FDA at 1-800-FDA-1088. Where should I keep my medicine? Keep out of the reach of children. Store at room temperature below 30 degrees C (86 degrees F). Protect from light. Keep container tightly closed. Throw away any unused medicine after the expiration date. NOTE: This sheet is a summary. It may not cover all possible information. If you have questions about this medicine, talk to your doctor, pharmacist, or health care provider.  2020 Elsevier/Gold Standard (2009-08-15 18:06:11)

## 2018-11-21 ENCOUNTER — Other Ambulatory Visit: Payer: Self-pay | Admitting: Family Medicine

## 2018-11-21 ENCOUNTER — Ambulatory Visit: Payer: Self-pay | Admitting: Family Medicine

## 2018-11-21 DIAGNOSIS — G4700 Insomnia, unspecified: Secondary | ICD-10-CM

## 2018-11-22 ENCOUNTER — Telehealth: Payer: Managed Care, Other (non HMO) | Admitting: Family Medicine

## 2018-11-28 ENCOUNTER — Telehealth: Payer: Self-pay | Admitting: Family Medicine

## 2018-11-28 DIAGNOSIS — G2581 Restless legs syndrome: Secondary | ICD-10-CM

## 2018-11-28 DIAGNOSIS — G4700 Insomnia, unspecified: Secondary | ICD-10-CM

## 2018-11-28 NOTE — Telephone Encounter (Signed)
I have spoken with patient via telephone today. She continues to have difficulty with insomnia and has been documenting symptoms at home. She has noticed that she has jerking movements of bilateral legs and an uneasy feeling when she gets in bed. She does not notice this during the day. She reports that she will drift off to sleep but wake with legs jerking. She has been referred to sleep psychiatry. She has not noted snoring but does note excessive fatigue and daytime sleepiness. She is requesting a sleep study. I have discussed case with Dr Felecia Shelling who agrees to pursue nocturnal polysomnography. Emma notified and orders to be placed.

## 2018-11-28 NOTE — Addendum Note (Signed)
Addended by: Rossie Muskrat L on: 11/28/2018 01:20 PM   Modules accepted: Orders

## 2019-10-02 ENCOUNTER — Telehealth: Payer: Self-pay | Admitting: Family Medicine

## 2019-10-03 ENCOUNTER — Ambulatory Visit: Payer: 59 | Admitting: Family Medicine

## 2023-04-21 ENCOUNTER — Other Ambulatory Visit: Payer: Self-pay

## 2023-04-21 ENCOUNTER — Emergency Department (HOSPITAL_COMMUNITY): Payer: Self-pay

## 2023-04-21 ENCOUNTER — Emergency Department (HOSPITAL_COMMUNITY)
Admission: EM | Admit: 2023-04-21 | Discharge: 2023-04-21 | Disposition: A | Discharge status: Home / Self Care | Payer: Self-pay | Attending: Emergency Medicine | Admitting: Emergency Medicine

## 2023-04-21 DIAGNOSIS — M5442 Lumbago with sciatica, left side: Secondary | ICD-10-CM | POA: Diagnosis not present

## 2023-04-21 DIAGNOSIS — M545 Low back pain, unspecified: Secondary | ICD-10-CM | POA: Diagnosis present

## 2023-04-21 LAB — CBC WITH DIFFERENTIAL/PLATELET
Abs Immature Granulocytes: 0.02 10*3/uL (ref 0.00–0.07)
Basophils Absolute: 0.1 10*3/uL (ref 0.0–0.1)
Basophils Relative: 1 %
Eosinophils Absolute: 0.6 10*3/uL — ABNORMAL HIGH (ref 0.0–0.5)
Eosinophils Relative: 8 %
HCT: 38.3 % (ref 36.0–46.0)
Hemoglobin: 13.5 g/dL (ref 12.0–15.0)
Immature Granulocytes: 0 %
Lymphocytes Relative: 41 %
Lymphs Abs: 3.1 10*3/uL (ref 0.7–4.0)
MCH: 31.3 pg (ref 26.0–34.0)
MCHC: 35.2 g/dL (ref 30.0–36.0)
MCV: 88.7 fL (ref 80.0–100.0)
Monocytes Absolute: 0.6 10*3/uL (ref 0.1–1.0)
Monocytes Relative: 8 %
Neutro Abs: 3.1 10*3/uL (ref 1.7–7.7)
Neutrophils Relative %: 42 %
Platelets: 397 10*3/uL (ref 150–400)
RBC: 4.32 MIL/uL (ref 3.87–5.11)
RDW: 11.8 % (ref 11.5–15.5)
WBC: 7.5 10*3/uL (ref 4.0–10.5)
nRBC: 0 % (ref 0.0–0.2)

## 2023-04-21 LAB — BASIC METABOLIC PANEL WITH GFR
Anion gap: 11 (ref 5–15)
BUN: 16 mg/dL (ref 6–20)
CO2: 27 mmol/L (ref 22–32)
Calcium: 9.8 mg/dL (ref 8.9–10.3)
Chloride: 100 mmol/L (ref 98–111)
Creatinine, Ser: 0.7 mg/dL (ref 0.44–1.00)
GFR, Estimated: >60 mL/min
Glucose, Bld: 104 mg/dL — ABNORMAL HIGH (ref 70–99)
Potassium: 3.6 mmol/L (ref 3.5–5.1)
Sodium: 138 mmol/L (ref 135–145)

## 2023-04-21 MED ORDER — GADOBUTROL 1 MMOL/ML IV SOLN
5.0000 mL | Freq: Once | INTRAVENOUS | Status: AC | PRN
  Administered 2023-04-21: 5 mL via INTRAVENOUS

## 2023-04-21 MED ORDER — PREGABALIN 75 MG PO CAPS: 75.0000 mg | ORAL_CAPSULE | Freq: Three times a day (TID) | ORAL | 2 refills | Status: AC

## 2023-04-21 MED ORDER — ONDANSETRON HCL 4 MG/2ML IJ SOLN
4.0000 mg | Freq: Four times a day (QID) | INTRAMUSCULAR | Status: DC | PRN
  Administered 2023-04-21: 4 mg via INTRAVENOUS
  Filled 2023-04-21: qty 2

## 2023-04-21 MED ORDER — OXYCODONE HCL 5 MG PO TABS: 5.0000 mg | ORAL_TABLET | ORAL | 0 refills | Status: AC | PRN

## 2023-04-21 MED ORDER — METHYLPREDNISOLONE 4 MG PO TBPK: ORAL_TABLET | ORAL | 0 refills | Status: AC

## 2023-04-21 MED ORDER — HYDROMORPHONE HCL 1 MG/ML IJ SOLN
1.0000 mg | INTRAMUSCULAR | Status: DC | PRN
  Administered 2023-04-21: 1 mg via INTRAVENOUS
  Filled 2023-04-21: qty 1

## 2023-04-21 MED ORDER — SODIUM CHLORIDE 0.9 % IV BOLUS
500.0000 mL | Freq: Once | INTRAVENOUS | Status: AC
  Administered 2023-04-21: 500 mL via INTRAVENOUS

## 2023-04-21 NOTE — Discharge Instructions (Signed)
 We evaluated you for your back pain.  Your testing is reassuring, and you are seen by your neurosurgeon Dr. Jordan Likes.  We feel that it is safe to go home.  Dr. Jordan Likes has made some medication adjustments.  Please follow-up with him in clinic.  Please return if you develop any new or worsening symptoms such as weakness in your legs, trouble walking, fevers or chills, worsening or severe pain, incontinence, or any other new symptoms.

## 2023-04-21 NOTE — ED Notes (Signed)
 Pt to MRI

## 2023-04-21 NOTE — ED Provider Notes (Signed)
   ED Course / MDM   Clinical Course as of 04/21/23 0843  Wed Apr 21, 2023  0708 Received sign out from Dr. Blinda Leatherwood, had surgery, presenting with back pain, has fluid collection on MRI, pending neurosurgery recommendations  [WS]  0840 Patient seen by Dr. Jordan Likes, who performed the patient's surgery.  Feels symptoms most consistent with postoperative seroma and postoperative pain and patient is stable for discharge.  He has adjusted the patient's medications.  The patient is comfortable with this plan.  Patient will follow-up with Dr. Jordan Likes in clinic. Will discharge patient to home. All questions answered. Patient comfortable with plan of discharge. Return precautions discussed with patient and specified on the after visit summary.  [WS]    Clinical Course User Index [WS] Lonell Grandchild, MD   Medical Decision Making Amount and/or Complexity of Data Reviewed Labs: ordered. Radiology: ordered.  Risk Prescription drug management.         Lonell Grandchild, MD 04/21/23 [phone]

## 2023-04-21 NOTE — Progress Notes (Signed)
 48 year old female recently status post left-sided L3-4 extraforaminal microdiscectomy.  Postoperatively patient has struggled with recurrent radicular pain.  Symptoms improved a little bit over the weekend on oral steroids but have become more severe again here lately.  Patient reports pain radiating into her left anterior thigh with some associated burning.  No weakness.  Bowel and bladder function normal.  No right-sided symptoms.  No fevers.  No wound issues.  On exam patient looks reasonably comfortable.  She does not appear toxic.  She is awake and alert.  She is oriented and appropriate.  Motor examination with some trace weakness in her left quadriceps muscle group otherwise motor strength intact.  Sensory examination with some decrease sensation in her left L3 dermatome.  Straight raising negative bilaterally.  Wound clean dry and intact.  Wound nontender.  MRI scan with postoperative changes on the left with some deep seroma which is noncompressive and not consistent with infection.  No evidence of obvious recurrent disc herniation or significant pathology within the left L3-4 neural foramen.  Remainder of her lumbar spine appears fine.  Postoperative radiculitis with severe pain.  Will discharge home with increases in her pain medication to oxycodone.  Add Lyrica and repeat steroid Dosepak.

## 2023-04-21 NOTE — ED Triage Notes (Signed)
 Patient reports persistent post op pain at lower back for 3 days unrelieved by RX pain medications , surgery done last Monday L3-L4.

## 2023-04-21 NOTE — ED Notes (Signed)
 Neuro surgery at bedside.

## 2023-04-21 NOTE — ED Notes (Signed)
 Pt verbalized understanding of discharge instructions. Opportunity for questions provided.

## 2023-04-21 NOTE — ED Provider Notes (Addendum)
 [place] EMERGENCY DEPARTMENT AT [facility] Provider Note   CSN: [id number] Arrival date & time: 04/21/23  0444     History  Chief Complaint  Patient presents with   Post Op Pain     Gabrielle Graham is a 48 y.o. female.  Patient presents to the emergency department for evaluation of back pain.  Patient has a history of chronic low back pain, underwent L3-L4 surgery just over a week ago.  She reports that she had improvement of her back pain for about 2 days after the surgery and then pain started again.  She was seen by Dr. Dutch Quint in the office several days ago and was given a steroid.  For the last 3 days pain has progressively worsened and is constant.  She cannot sleep.  Patient reports some numbness of the anterior left thigh and pain radiates down the left leg.  No change in bowel or bladder function.  She is scheduled for an outpatient MRI to further evaluate but could not tolerate the pain today.       Home Medications Prior to Admission medications   Medication Sig Start Date End Date Taking? Authorizing Provider  busPIRone (BUSPAR) 10 MG tablet Take 1 tablet (10 mg total) by mouth 3 (three) times daily. Start 1 tablet at bedtime for 1 week, then increase to 10mg  twice daily for 1-2 weeks, 1 tablet three times daily 11/16/18   Lomax, Amy, NP  clonazePAM (KLONOPIN) 0.5 MG tablet Take 0.5 mg by mouth.    [provider]  norethindrone-ethinyl estradiol-iron (LOESTRIN FE) 1.5-30 MG-MCG tablet Take 1 tablet by mouth daily.    [provider]  omeprazole (PRILOSEC) 40 MG capsule Take 40 mg by mouth daily.    [provider]      Allergies    Patient has no known allergies.    Review of Systems   Review of Systems  Physical Exam Updated Vital Signs BP (!) 147/104 (BP Location: Right Arm)   Pulse (!) 127   Temp 97.9 F (36.6 C)   Resp 18   SpO2 100%  Physical Exam Vitals and nursing note reviewed.  Constitutional:      General: She  is not in acute distress.    Appearance: She is well-developed.  HENT:     Head: Normocephalic and atraumatic.     Mouth/Throat:     Mouth: Mucous membranes are moist.  Eyes:     General: Vision grossly intact. Gaze aligned appropriately.     Extraocular Movements: Extraocular movements intact.     Conjunctiva/sclera: Conjunctivae normal.  Cardiovascular:     Rate and Rhythm: Normal rate and regular rhythm.     Pulses: Normal pulses.     Heart sounds: Normal heart sounds, S1 normal and S2 normal. No murmur heard.    No friction rub. No gallop.  Pulmonary:     Effort: Pulmonary effort is normal. No respiratory distress.     Breath sounds: Normal breath sounds.  Abdominal:     General: Bowel sounds are normal.     Palpations: Abdomen is soft.     Tenderness: There is no abdominal tenderness. There is no guarding or rebound.     Hernia: No hernia is present.  Musculoskeletal:        General: No swelling.     Cervical back: Full passive range of motion without pain, normal range of motion and neck supple. No spinous process tenderness or muscular tenderness. Normal range of  motion.     Right lower leg: No edema.     Left lower leg: No edema.  Skin:    General: Skin is warm and dry.     Capillary Refill: Capillary refill takes less than 2 seconds.     Findings: No ecchymosis, erythema, rash or wound.  Neurological:     General: No focal deficit present.     Mental Status: She is alert and oriented to person, place, and time.     GCS: GCS eye subscore is 4. GCS verbal subscore is 5. GCS motor subscore is 6.     Cranial Nerves: Cranial nerves 2-12 are intact.     Sensory: Sensation is intact.     Motor: Motor function is intact.     Coordination: Coordination is intact.     Comments: No foot drop, no saddle anesthesia  Psychiatric:        Attention and Perception: Attention normal.        Mood and Affect: Mood normal.        Speech: Speech normal.        Behavior: Behavior  normal.     ED Results / Procedures / Treatments   Labs (all labs ordered are listed, but only abnormal results are displayed) Labs Reviewed  CBC WITH DIFFERENTIAL/PLATELET  BASIC METABOLIC PANEL WITH GFR    EKG None  Radiology No results found.  Procedures Procedures    Medications Ordered in ED Medications  HYDROmorphone (DILAUDID) injection 1 mg (has no administration in time range)  ondansetron (ZOFRAN) injection 4 mg (has no administration in time range)  sodium chloride 0.9 % bolus 500 mL (has no administration in time range)    ED Course/ Medical Decision Making/ A&P                                 Medical Decision Making Amount and/or Complexity of Data Reviewed Labs: ordered. Radiology: ordered.  Risk Prescription drug management.   Patient presents with severe low back pain, left-sided radiculopathy just over a week after her L3-L4 surgery.  Examination reveals that the patient is uncomfortable but she does not have any neurologic deficit.  No acute signs of cauda equina syndrome.  Patient will be provided analgesia.  Her neurosurgeon, Dr. Jordan Likes has scheduled her for outpatient MRI to further evaluate.  This will be performed here in the ED today, as we can provide analgesia to get her through the study.    MRI has been performed.  Patient with what appears to be postoperative seroma but no other significant findings.  Contacted neurosurgery, will evaluate patient in ED.      Final Clinical Impression(s) / ED Diagnoses Final diagnoses:  Acute left-sided low back pain with left-sided sciatica    Rx / DC Orders ED Discharge Orders     None         Josanne Boerema, Canary Brim, MD 04/21/23 6295    Gilda Crease, MD 04/21/23 [phone]
